# Patient Record
Sex: Female | Born: 1950 | Race: White | Hispanic: No | Smoking: Never smoker
Health system: Southern US, Community
[De-identification: ages and names within clinical notes are randomized; demographics above are authoritative.]

## PROBLEM LIST (undated history)

## (undated) DIAGNOSIS — F0390 Unspecified dementia without behavioral disturbance: Secondary | ICD-10-CM

## (undated) DIAGNOSIS — E039 Hypothyroidism, unspecified: Secondary | ICD-10-CM

## (undated) DIAGNOSIS — M1990 Unspecified osteoarthritis, unspecified site: Secondary | ICD-10-CM

## (undated) DIAGNOSIS — F419 Anxiety disorder, unspecified: Secondary | ICD-10-CM

## (undated) DIAGNOSIS — N289 Disorder of kidney and ureter, unspecified: Secondary | ICD-10-CM

## (undated) HISTORY — DX: Anxiety disorder, unspecified: F41.9

## (undated) HISTORY — DX: Disorder of kidney and ureter, unspecified: N28.9

## (undated) HISTORY — PX: VAGINAL HYSTERECTOMY: SUR661

## (undated) HISTORY — DX: Unspecified dementia, unspecified severity, without behavioral disturbance, psychotic disturbance, mood disturbance, and anxiety: F03.90

## (undated) HISTORY — DX: Unspecified osteoarthritis, unspecified site: M19.90

## (undated) HISTORY — DX: Hypothyroidism, unspecified: E03.9

---

## 2016-08-01 DIAGNOSIS — F411 Generalized anxiety disorder: Secondary | ICD-10-CM | POA: Insufficient documentation

## 2016-08-01 DIAGNOSIS — F0390 Unspecified dementia without behavioral disturbance: Secondary | ICD-10-CM | POA: Insufficient documentation

## 2016-08-01 DIAGNOSIS — F03A0 Unspecified dementia, mild, without behavioral disturbance, psychotic disturbance, mood disturbance, and anxiety: Secondary | ICD-10-CM | POA: Insufficient documentation

## 2016-08-30 ENCOUNTER — Other Ambulatory Visit: Payer: Self-pay | Admitting: Neurology

## 2016-08-30 DIAGNOSIS — R4189 Other symptoms and signs involving cognitive functions and awareness: Secondary | ICD-10-CM

## 2016-09-12 ENCOUNTER — Ambulatory Visit: Payer: Medicare Other

## 2016-10-12 ENCOUNTER — Ambulatory Visit
Admission: RE | Admit: 2016-10-12 | Discharge: 2016-10-12 | Disposition: A | Discharge status: Home / Self Care | Payer: Medicare Other | Source: Ambulatory Visit | Attending: Neurology | Admitting: Neurology

## 2016-10-12 ENCOUNTER — Encounter: Payer: Self-pay | Admitting: Radiology

## 2016-10-12 DIAGNOSIS — R4189 Other symptoms and signs involving cognitive functions and awareness: Secondary | ICD-10-CM

## 2017-05-20 ENCOUNTER — Other Ambulatory Visit: Payer: Self-pay | Admitting: Family Medicine

## 2017-05-20 DIAGNOSIS — Z1231 Encounter for screening mammogram for malignant neoplasm of breast: Secondary | ICD-10-CM

## 2017-06-26 DIAGNOSIS — R4189 Other symptoms and signs involving cognitive functions and awareness: Secondary | ICD-10-CM | POA: Insufficient documentation

## 2017-10-16 DIAGNOSIS — E039 Hypothyroidism, unspecified: Secondary | ICD-10-CM | POA: Insufficient documentation

## 2018-06-30 ENCOUNTER — Encounter: Payer: Self-pay | Admitting: Podiatry

## 2018-06-30 ENCOUNTER — Ambulatory Visit (INDEPENDENT_AMBULATORY_CARE_PROVIDER_SITE_OTHER): Payer: Medicare Other | Admitting: Podiatry

## 2018-06-30 DIAGNOSIS — M779 Enthesopathy, unspecified: Secondary | ICD-10-CM

## 2018-06-30 DIAGNOSIS — D3610 Benign neoplasm of peripheral nerves and autonomic nervous system, unspecified: Secondary | ICD-10-CM | POA: Diagnosis not present

## 2018-06-30 NOTE — Progress Notes (Signed)
This patient presents the office with chief complaint of numbness and tingling in her toes on both feet.  She says she's been experiencing numbness numbness for years and believes it is due to back problems that she has been diagnosed and treated.  She says that the numbness and tingling in her feet has been increasing the last month.  He says this area becomes painful as she walks wearing her shoes. She points to the area of her forefoot as the site of most burningand tingling.   She has provided no self treatment nor sought any professional help. She presents the office today for an evaluation and treatment of her numb and tingling toes.   General Appearance  Alert, conversant and in no acute stress.  Vascular  Dorsalis pedis and posterior tibial  pulses are palpable  bilaterally.  Capillary return is within normal limits  bilaterally. Temperature is within normal limits  bilaterally.  Neurologic  Senn-Weinstein monofilament wire test within normal limits  bilaterally. Muscle power within normal limits bilaterally.  Nails normal nails noted with no evidence of bacterial or fungal infection.  Orthopedic  No limitations of motion of motion feet .  No crepitus or effusions noted.  No bony pathology or digital deformities noted. Palpable pain noted in the second and third interspace bilaterally.  No evidence of any redness, swelling noted.  DJD first MPJ bilaterally  Skin  normotropic skin with no porokeratosis noted bilaterally.  No signs of infections or ulcers noted.    Neuroma/ Capsulitis forefeet  B/L  IE  Injection therapy.  Injection therapy using 1.0 cc. Of 2% xylocaine( 20 mg.) plus 1 cc. of kenalog-la ( 10 mg) plus 1/2 cc. of dexamethazone phosphate ( 2 mg) .  Padding to be applied to her insoles.  RTC 4 weeks.   Gardiner Barefoot DPM

## 2018-07-21 ENCOUNTER — Ambulatory Visit (INDEPENDENT_AMBULATORY_CARE_PROVIDER_SITE_OTHER): Payer: Medicare Other | Admitting: Podiatry

## 2018-07-21 ENCOUNTER — Telehealth: Payer: Self-pay | Admitting: Podiatry

## 2018-07-21 ENCOUNTER — Encounter: Payer: Self-pay | Admitting: Podiatry

## 2018-07-21 DIAGNOSIS — M779 Enthesopathy, unspecified: Secondary | ICD-10-CM | POA: Diagnosis not present

## 2018-07-21 DIAGNOSIS — D3610 Benign neoplasm of peripheral nerves and autonomic nervous system, unspecified: Secondary | ICD-10-CM

## 2018-07-21 NOTE — Telephone Encounter (Signed)
Per pt nieceGildardo Ramos) Dana Ramos is coming in for a injection.  Pt. Is wanting to use a wheelchair at [facility]. Her niece is wondering if she really needs to use a wheelchair after a injection. Would like for you to explain to pt what she needs to do as far as her mobility at [facility]. Please send notes back to facility with findings and if she needs to use  A wheelchair. In Dana Ramos mind she feels like you told her to use a wheelchair, so she gets really upset.

## 2018-07-21 NOTE — Progress Notes (Signed)
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  Patient states that she feels like her feet have made her feel 20 years younger.  She says she has better motion in the toes on both feet. Following the injection.  She says there is still numbness and tingling in her foot, but she is much better.  She returns to the office for continued evaluation and treatment of her feet.  She was told to continue to use the padding that was applied last visit.  General Appearance  Alert, conversant and in no acute stress.  Vascular  Dorsalis pedis and posterior tibial  pulses are palpable  bilaterally.  Capillary return is within normal limits  bilaterally. Temperature is within normal limits  bilaterally.  Neurologic  Senn-Weinstein monofilament wire test within normal limits  bilaterally. Muscle power within normal limits bilaterally.  Nails normal nails noted with no evidence of bacterial or fungal infection.  Orthopedic  No limitations of motion of motion feet .  No crepitus or effusions noted. 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  Skin  normotropic skin with no porokeratosis noted bilaterally.  No signs of infections or ulcers noted.    S/P capsulitis /neuroma forefoot  B/l.    ROV.  Patient is very pleased with her progress and states she is much better.  She was told to return to the clinic if the problem reoccurs.     Gardiner Barefoot DPM

## 2018-07-22 ENCOUNTER — Telehealth: Payer: Self-pay | Admitting: Podiatry

## 2018-07-22 NOTE — Telephone Encounter (Signed)
Pt's niece, Gildardo Griffes states she thinks they're okay now, but this morning states Nanine Means stated pt had walked down to the lunchroom and then later stated she could not walk. Lattie Haw asked if pt received any treatment or injection yesterday. I told her pt did not receive an injection or treatment/procedure.

## 2018-07-22 NOTE — Telephone Encounter (Signed)
Gildardo Griffes is the pts niece. Pt is saying that she is unable to walk today. Wants to speak with you.

## 2018-09-25 DIAGNOSIS — M545 Low back pain: Secondary | ICD-10-CM

## 2018-09-25 DIAGNOSIS — R200 Anesthesia of skin: Secondary | ICD-10-CM | POA: Insufficient documentation

## 2018-09-25 DIAGNOSIS — G8929 Other chronic pain: Secondary | ICD-10-CM | POA: Insufficient documentation

## 2018-09-29 ENCOUNTER — Other Ambulatory Visit: Payer: Self-pay | Admitting: Acute Care

## 2018-09-29 DIAGNOSIS — M545 Low back pain: Secondary | ICD-10-CM

## 2018-10-11 ENCOUNTER — Ambulatory Visit: Payer: Medicare Other

## 2018-10-22 ENCOUNTER — Ambulatory Visit
Admission: RE | Admit: 2018-10-22 | Discharge: 2018-10-22 | Disposition: A | Discharge status: Home / Self Care | Payer: Medicare Other | Source: Ambulatory Visit | Attending: Acute Care | Admitting: Acute Care

## 2018-10-22 DIAGNOSIS — M5136 Other intervertebral disc degeneration, lumbar region: Secondary | ICD-10-CM | POA: Diagnosis not present

## 2018-10-22 DIAGNOSIS — G8929 Other chronic pain: Secondary | ICD-10-CM | POA: Diagnosis not present

## 2018-10-22 DIAGNOSIS — M545 Low back pain, unspecified: Secondary | ICD-10-CM

## 2018-10-22 DIAGNOSIS — M4316 Spondylolisthesis, lumbar region: Secondary | ICD-10-CM | POA: Diagnosis not present

## 2018-10-22 DIAGNOSIS — M5137 Other intervertebral disc degeneration, lumbosacral region: Secondary | ICD-10-CM | POA: Insufficient documentation

## 2018-10-22 DIAGNOSIS — R200 Anesthesia of skin: Secondary | ICD-10-CM | POA: Diagnosis not present

## 2018-10-22 DIAGNOSIS — M48061 Spinal stenosis, lumbar region without neurogenic claudication: Secondary | ICD-10-CM | POA: Insufficient documentation

## 2019-01-26 ENCOUNTER — Encounter: Payer: Self-pay | Admitting: Psychiatry

## 2019-01-26 ENCOUNTER — Ambulatory Visit (INDEPENDENT_AMBULATORY_CARE_PROVIDER_SITE_OTHER): Payer: Medicare Other | Admitting: Psychiatry

## 2019-01-26 ENCOUNTER — Other Ambulatory Visit: Payer: Self-pay

## 2019-01-26 VITALS — BP 144/73 | HR 73 | Temp 98.7°F | Ht 61.81 in | Wt 139.2 lb

## 2019-01-26 DIAGNOSIS — F0281 Dementia in other diseases classified elsewhere with behavioral disturbance: Secondary | ICD-10-CM | POA: Diagnosis not present

## 2019-01-26 DIAGNOSIS — F02818 Dementia in other diseases classified elsewhere, unspecified severity, with other behavioral disturbance: Secondary | ICD-10-CM

## 2019-01-26 DIAGNOSIS — F411 Generalized anxiety disorder: Secondary | ICD-10-CM | POA: Diagnosis not present

## 2019-01-26 MED ORDER — SERTRALINE HCL 100 MG PO TABS: 100.0000 mg | ORAL_TABLET | Freq: Every day | ORAL | 2 refills | Status: AC

## 2019-01-26 MED ORDER — ARIPIPRAZOLE 2 MG PO TABS: 2.0000 mg | ORAL_TABLET | Freq: Every day | ORAL | 1 refills | Status: AC

## 2019-01-26 NOTE — Patient Instructions (Signed)
Aripiprazole tablets What is this medicine? ARIPIPRAZOLE (ay ri PIP ray zole) is an atypical antipsychotic. It is used to treat schizophrenia and bipolar disorder, also known as manic-depression. It is also used to treat Tourette's disorder and some symptoms of autism. This medicine may also be used in combination with antidepressants to treat major depressive disorder. This medicine may be used for other purposes; ask your health care provider or pharmacist if you have questions. COMMON BRAND NAME(S): Abilify What should I tell my health care provider before I take this medicine? They need to know if you have any of these conditions: -dehydration -dementia -diabetes -heart disease -history of stroke -low blood counts, like low white cell, platelet, or red cell counts -Parkinson's disease -seizures -suicidal thoughts, plans, or attempt; a previous suicide attempt by you or a family member -an unusual or allergic reaction to aripiprazole, other medicines, foods, dyes, or preservatives -pregnant or trying to get pregnant -breast-feeding How should I use this medicine? Take this medicine by mouth with a glass of water. Follow the directions on the prescription label. You can take this medicine with or without food. Take your doses at regular intervals. Do not take your medicine more often than directed. Do not stop taking except on the advice of your doctor or health care professional. A special MedGuide will be given to you by the pharmacist with each prescription and refill. Be sure to read this information carefully each time. Talk to your pediatrician regarding the use of this medicine in children. While this drug may be prescribed for children as young as 6 years of age for selected conditions, precautions do apply. Overdosage: If you think you have taken too much of this medicine contact a poison control center or emergency room at once. NOTE: This medicine is only for you. Do not share  this medicine with others. What if I miss a dose? If you miss a dose, take it as soon as you can. If it is almost time for your next dose, take only that dose. Do not take double or extra doses. What may interact with this medicine? Do not take this medicine with any of the following medications: -brexpiprazole -cisapride -dofetilide -dronedarone -metoclopramide -pimozide -thioridazine This medicine may also interact with the following medications: -alcohol -carbamazepine -certain medicines for anxiety or sleep -certain medicines for blood pressure -certain medicines for fungal infections like ketoconazole, fluconazole, posaconazole, and itraconazole -clarithromycin -fluoxetine -other medicines that prolong the QT interval (cause an abnormal heart rhythm) -paroxetine -quinidine -rifampin This list may not describe all possible interactions. Give your health care provider a list of all the medicines, herbs, non-prescription drugs, or dietary supplements you use. Also tell them if you smoke, drink alcohol, or use illegal drugs. Some items may interact with your medicine. What should I watch for while using this medicine? Visit your doctor or health care professional for regular checks on your progress. It may be several weeks before you see the full effects of this medicine. Do not suddenly stop taking this medicine. You may need to gradually reduce the dose. Patients and their families should watch out for worsening depression or thoughts of suicide. Also watch out for sudden changes in feelings such as feeling anxious, agitated, panicky, irritable, hostile, aggressive, impulsive, severely restless, overly excited and hyperactive, or not being able to sleep. If this happens, especially at the beginning of antidepressant treatment or after a change in dose, call your health care professional. You may get dizzy or drowsy. Do   not drive, use machinery, or do anything that needs mental  alertness until you know how this medicine affects you. Do not stand or sit up quickly, especially if you are an older patient. This reduces the risk of dizzy or fainting spells. Alcohol can increase dizziness and drowsiness. Avoid alcoholic drinks. This medicine can reduce the response of your body to heat or cold. Dress warmly in cold weather and stay hydrated in hot weather. If possible, avoid extreme temperatures like saunas, hot tubs, very hot or cold showers, or activities that can cause dehydration such as vigorous exercise. This medicine may cause dry eyes and blurred vision. If you wear contact lenses, you may feel some discomfort. Lubricating drops may help. See your eye doctor if the problem does not go away or is severe. If you notice an increased hunger or thirst, different from your normal hunger or thirst, or if you find that you have to urinate more frequently, you should contact your health care provider as soon as possible. You may need to have your blood sugar monitored. This medicine may cause changes in your blood sugar levels. You should monitor your blood sugar frequently if you have diabetes. There have been reports of uncontrollable and strong urges to gamble, binge eat, shop, and have sex while taking this medicine. If you experience any of these or other uncontrollable and strong urges while taking this medicine, you should report it to your health care provider as soon as possible. What side effects may I notice from receiving this medicine? Side effects that you should report to your doctor or health care professional as soon as possible: -allergic reactions like skin rash, itching or hives, swelling of the face, lips, or tongue -breathing problems -confusion -feeling faint or lightheaded, falls -fever or chills, sore throat -increased hunger or thirst -increased urination -joint pain -muscles pain, spasms -problems with balance, talking, walking -restlessness or need to  keep moving -seizures -suicidal thoughts or other mood changes -trouble swallowing -uncontrollable and excessive urges (examples: gambling, binge eating, shopping, having sex) -uncontrollable head, mouth, neck, arm, or leg movements -unusually weak or tired Side effects that usually do not require medical attention (report to your doctor or health care professional if they continue or are bothersome): -blurred vision -constipation -headache -nausea, vomiting -trouble sleeping -weight gain This list may not describe all possible side effects. Call your doctor for medical advice about side effects. You may report side effects to FDA at 1-800-FDA-1088. Where should I keep my medicine? Keep out of the reach of children. Store at room temperature between 15 and 30 degrees C (59 and 86 degrees F). Throw away any unused medicine after the expiration date. NOTE: This sheet is a summary. It may not cover all possible information. If you have questions about this medicine, talk to your doctor, pharmacist, or health care provider.  2019 Elsevier/Gold Standard (2018-06-12 09:13:39)  
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
## 2019-02-02 ENCOUNTER — Telehealth: Payer: Self-pay

## 2019-02-02 ENCOUNTER — Telehealth: Payer: Self-pay | Admitting: Psychiatry

## 2019-02-02 NOTE — Telephone Encounter (Signed)
pt niece called states that dr. Shea Evans wanted to speak with her. she has an appt today at 46 but you can after that.

## 2019-02-02 NOTE — Telephone Encounter (Signed)
Returned call to Ms.Makemie Park - [phone]. Niece.  She and her husband lived a reclusive life style. Kept close contact with her mother through phone. The reason she was asked to see a psychiatrist was because of hoarding. Moved in with mother in 2015 . Some one reported to social services in 2017 about both patient and mother . She ended up at the ALF after that. Lattie Haw believes she is doing better on the new medications. Will be OK to share information in future.

## 2019-04-16 NOTE — Progress Notes (Signed)
 [facility] - Documentation Encounter  Care Coordinator conducted a chart review for the following:  Preventive Care-  Gap Closure 04/16/2019  Breast Cancer Screening PCP/Specialist Appointment already scheduled - appointment note edited  Comments: specialist appointment on 04/29/2019  Colorectal Cancer Screening PCP/Specialist Appointment already scheduled - appointment note edited  Comments: specialist appointment on 04/29/2019    Payer Activity- No flowsheet data found.  Dana Ramos  For more information on [facility] services, click here.

## 2020-03-05 IMAGING — MR MR LUMBAR SPINE W/O CM
5 of 7 series · 30 of 48 positions shown · non-contrast
Comparison: None.

CLINICAL DATA: Lifting injury with low back pain since 1780. Pain
radiates to both legs.

EXAM:
MRI LUMBAR SPINE WITHOUT CONTRAST
TECHNIQUE: Multiplanar, multisequence MR imaging of the lumbar spine was
performed. No intravenous contrast was administered.

[Series 5: T2 · sagittal · 4.0mm · 1.02mm/px · 7 of 17 slices shown (1 of 3)]
[im 1/17]
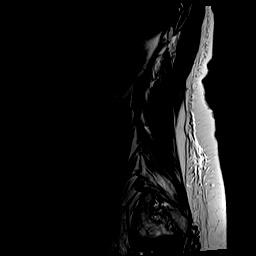
[im 3/17]
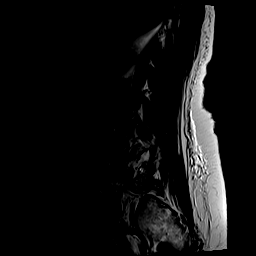
[im 6/17]
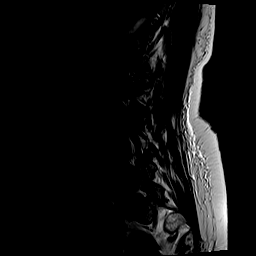
[im 9/17]
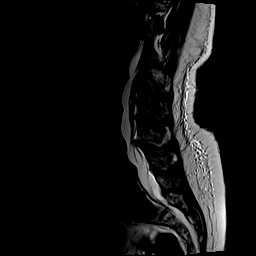
[im 11/17]
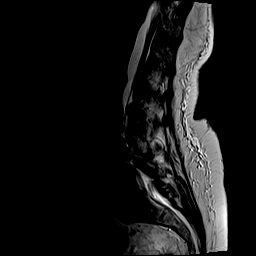
[im 14/17]
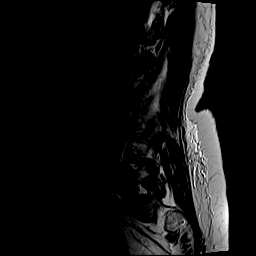
[im 17/17]
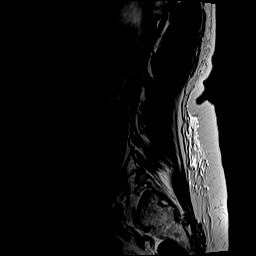

[Series 6: T1 · sagittal · 4.0mm · 1.02mm/px · 7 of 17 slices shown (1 of 2)]
[im 1/17]
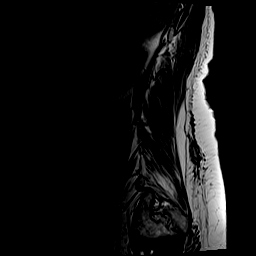
[im 3/17]
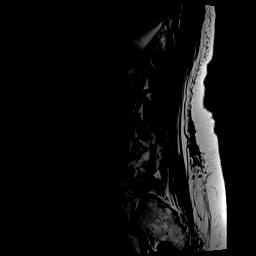
[im 6/17]
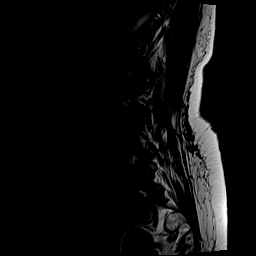
[im 9/17]
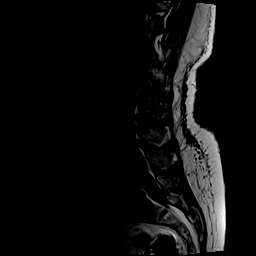
[im 11/17]
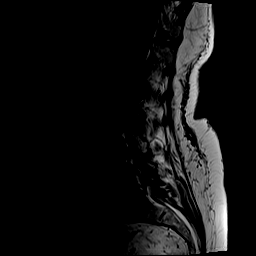
[im 14/17]
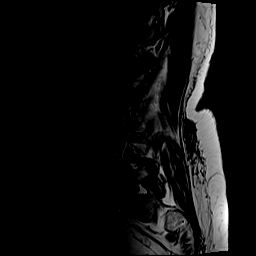
[im 17/17]
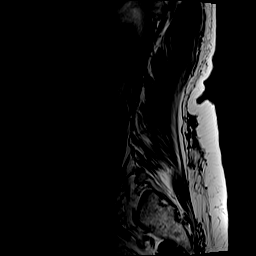

[Series 8: T2 · axial · 4.0mm · 0.57mm/px · z∈[+9,+126]mm · 9 of 25 slices shown (2 of 3)]
[im 1/25]
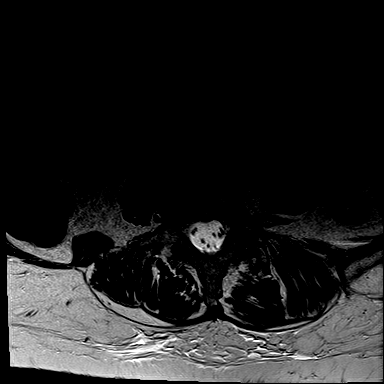
[im 4/25]
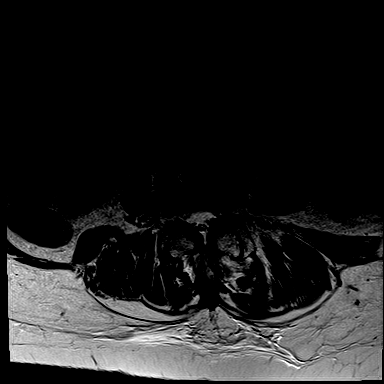
[im 7/25]
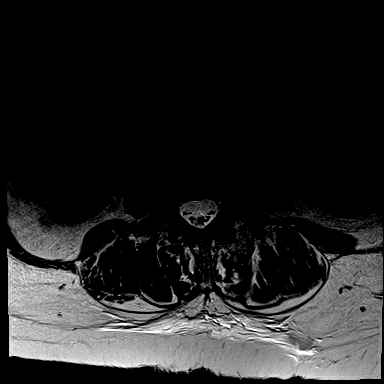
[im 10/25]
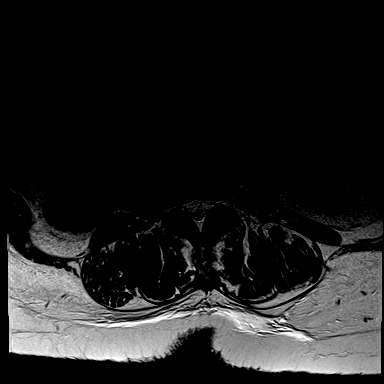
[im 13/25]
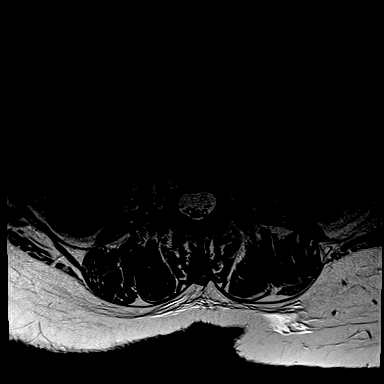
[im 16/25]
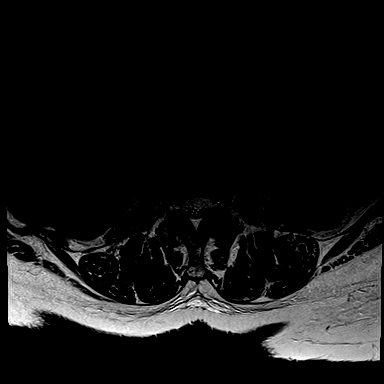
[im 19/25]
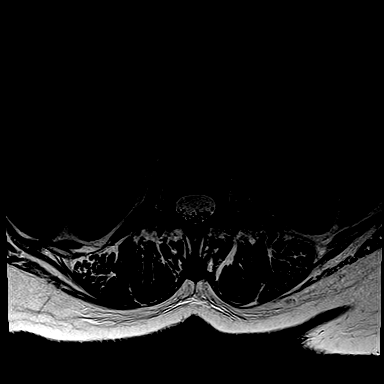
[im 22/25]
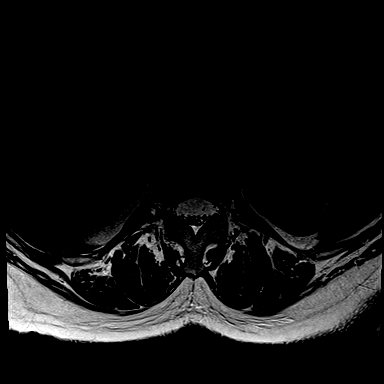
[im 25/25]
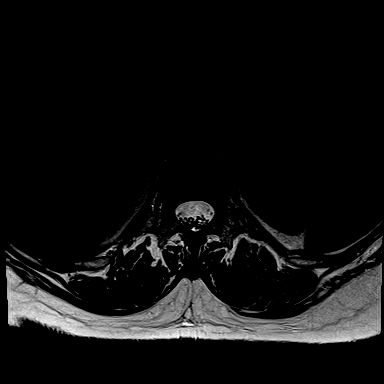

[Series 9: T2 · axial · 4.0mm · 0.57mm/px · z∈[-130,-64]mm · 5 of 15 slices shown (3 of 3)]
[im 1/15]
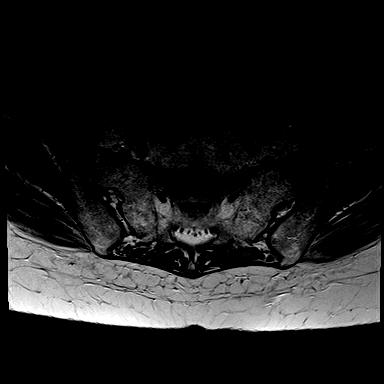
[im 4/15]
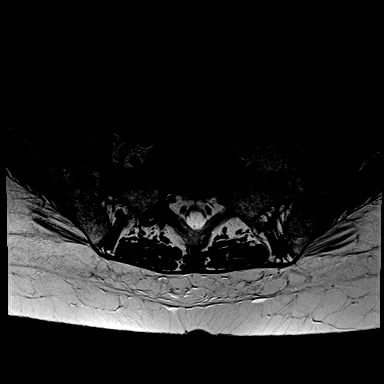
[im 8/15]
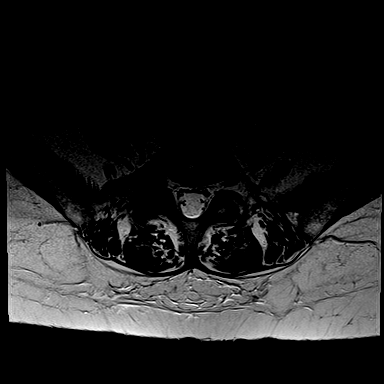
[im 11/15]
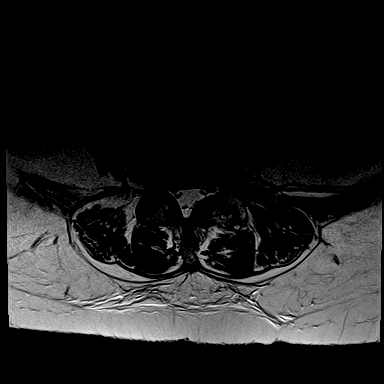
[im 15/15]
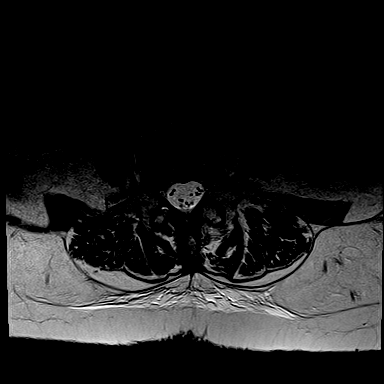

[Series 10: T1 · axial · 4.0mm · 0.29mm/px · z∈[+9,+23]mm · 2 of 25 slices shown (2 of 2)]
[im 1/25]
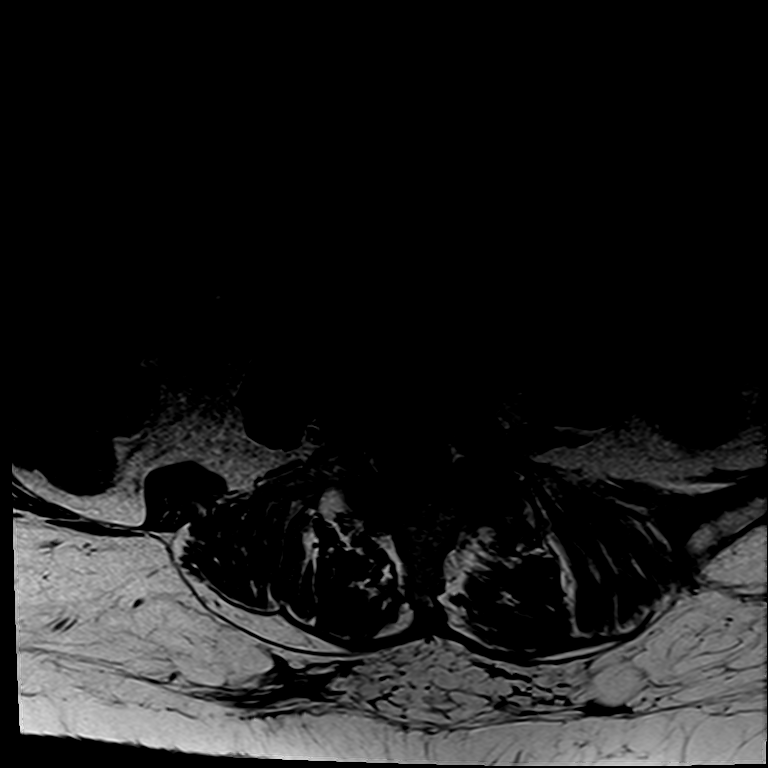
[im 4/25]
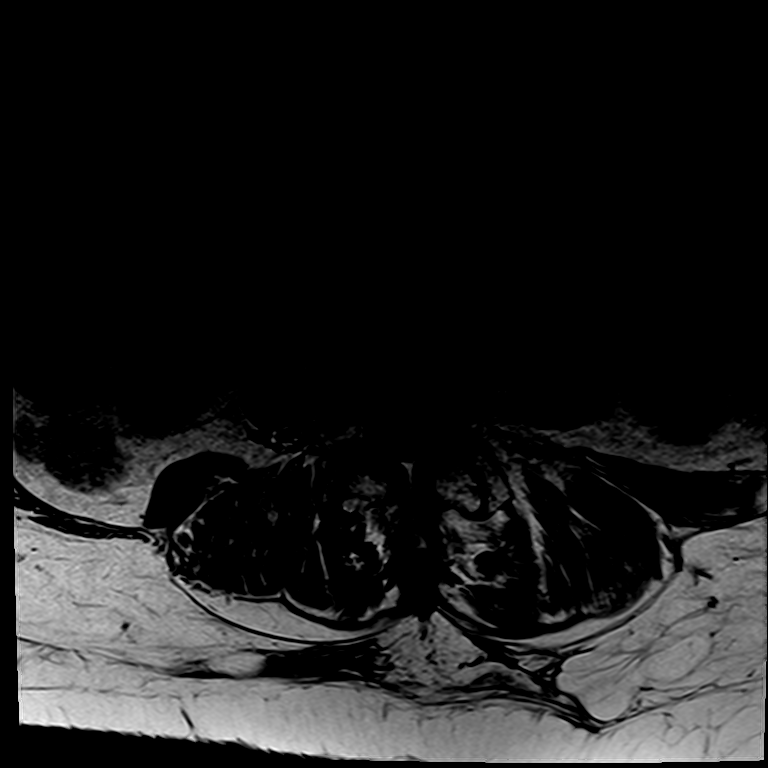

[30 of 48 positions shown; findings below may reference images not displayed]

FINDINGS: Segmentation:  5 lumbar type vertebral bodies assumed.

Alignment: Curvature convex to the right with the apex at L2-3. 2 mm
anterolisthesis L2-3. 3 mm anterolisthesis L3-4. 2 mm
anterolisthesis L4-5.

Vertebrae: No fracture or primary bone lesion. Discogenic endplate
changes at L4-5 with some edema, which may be associated with back
pain.

Conus medullaris and cauda equina: Conus extends to the T12 level.
Conus and cauda equina appear normal.

Paraspinal and other soft tissues: Negative

Disc levels:

No abnormality at T11-12 or T12-L1.

L1-2: Minimal disc bulge.  No stenosis.

L2-3: Mild bulging of the disc. Facet degeneration. One or 2 mm of
anterolisthesis. Mild narrowing of the lateral recesses without
likely neural compression.

L3-4: 3 mm of anterolisthesis due to facet osteoarthritis. Mild
bulging of the disc. Mild stenosis of the lateral recesses and
foramina, left more than right. No definite neural compression.

L4-5: Disc degeneration with loss of disc height, endplate
osteophytes and bulging of the disc. Discogenic endplate marrow
changes which could be associated with back pain. Facet and
ligamentous hypertrophy. Narrowing of the lateral recesses right
more than left. Some potential for neural compression in the lateral
recesses, particularly on the right. There is foraminal narrowing
more pronounced on the left.

L5-S1: Mild bulging of the disc. Mild facet osteoarthritis. No canal
or foraminal stenosis. Some edema associated with the facet joint on
the right could be painful.
IMPRESSION: L2-3: Mild non-compressive degenerative changes.

L3-4: Facet degeneration with 3 mm of anterolisthesis. Bulging of
the disc. Mild narrowing of the lateral recesses and foramina left
more than right. No definite neural compression however.

L4-5: More advanced chronic disc degeneration with endplate
osteophytes and bulging of the disc. Discogenic endplate changes
with edema, which could be associated with back pain. Lateral recess
and foraminal narrowing with some potential for neural compression.
Lateral recess narrowing is slightly more pronounced on the right.
Foraminal narrowing is more pronounced on the left.

L5-S1 facet degeneration worse on the right with mild facet joint
edema which could be associated with pain.

## 2022-09-29 ENCOUNTER — Other Ambulatory Visit: Payer: Self-pay

## 2022-09-29 ENCOUNTER — Emergency Department: Payer: Medicare Other

## 2022-09-29 ENCOUNTER — Emergency Department
Admission: EM | Admit: 2022-09-29 | Discharge: 2022-09-29 | Disposition: A | Discharge status: Skilled Nursing Facility | Payer: Medicare Other | Attending: Emergency Medicine | Admitting: Emergency Medicine

## 2022-09-29 ENCOUNTER — Encounter: Payer: Self-pay | Admitting: Intensive Care

## 2022-09-29 DIAGNOSIS — M25551 Pain in right hip: Secondary | ICD-10-CM | POA: Diagnosis not present

## 2022-09-29 DIAGNOSIS — M25561 Pain in right knee: Secondary | ICD-10-CM | POA: Diagnosis present

## 2022-09-29 DIAGNOSIS — F0390 Unspecified dementia without behavioral disturbance: Secondary | ICD-10-CM | POA: Insufficient documentation

## 2022-09-29 DIAGNOSIS — Y92009 Unspecified place in unspecified non-institutional (private) residence as the place of occurrence of the external cause: Secondary | ICD-10-CM | POA: Insufficient documentation

## 2022-09-29 DIAGNOSIS — W010XXA Fall on same level from slipping, tripping and stumbling without subsequent striking against object, initial encounter: Secondary | ICD-10-CM | POA: Insufficient documentation

## 2022-09-29 HISTORY — DX: Unspecified osteoarthritis, unspecified site: M19.90

## 2022-09-29 HISTORY — DX: Hypothyroidism, unspecified: E03.9

## 2022-09-29 HISTORY — DX: Disorder of kidney and ureter, unspecified: N28.9

## 2022-09-29 NOTE — ED Provider Notes (Signed)
[facility] Provider Note    Event Date/Time   First MD Initiated Contact with Patient 09/29/22 1938     (approximate)   History   Chief Complaint: Fall   HPI  Dana Ramos is a 72 y.o. female with a history of anxiety, dementia who is brought to the ED by EMS from [facility] assisted living due to a fall.  Patient reports that she had a fall at home due to tripping and losing balance.  She reports this happened 2 weeks ago.  Denies falling today.  Denies head injury or neck pain.  Only pain is in the right hip and the right knee.  She has been able to stand and bear weight since the fall.  Denies any other complaints, no chest pain or palpitations or shortness of breath.     Physical Exam   Triage Vital Signs: ED Triage Vitals  Enc Vitals Group     BP 09/29/22 1858 136/85     Pulse Rate 09/29/22 1858 88     Resp 09/29/22 1858 16     Temp 09/29/22 1858 98.7 F (37.1 C)     Temp Source 09/29/22 1858 Oral     SpO2 09/29/22 1858 93 %     Weight 09/29/22 1852 180 lb (81.6 kg)     Height 09/29/22 1852 '5\' 4"'$  (1.626 m)     Head Circumference --      Peak Flow --      Pain Score 09/29/22 1852 8     Pain Loc --      Pain Edu? --      Excl. in [place]? --     Most recent vital signs: Vitals:   09/29/22 1858  BP: 136/85  Pulse: 88  Resp: 16  Temp: 98.7 F (37.1 C)  SpO2: 93%    General: Awake, no distress.  CV:  Good peripheral perfusion.  Regular rate, heart rate 80 Resp:  Normal effort.  Clear to auscultation bilaterally Abd:  No distention.  Soft nontender Other:  Pelvis stable and nontender.  Full range of motion in all extremities.  No focal bony tenderness.  No abrasions or bruising on the extremities or scalp.  No spinal tenderness.  Full range of motion in the C-spine.   ED Results / Procedures / Treatments   Labs (all labs ordered are listed, but only abnormal results are displayed) Labs Reviewed - No data to  display   EKG    RADIOLOGY X-ray right hip and pelvis interpreted by me, negative for fracture.  Radiology report reviewed.  X-ray right knee interpreted by me, negative for fracture.  Radiology report reviewed   PROCEDURES:  Procedures   MEDICATIONS ORDERED IN ED: Medications - No data to display   IMPRESSION / MDM / [place] / ED COURSE  I reviewed the triage vital signs and the nursing notes.                              Differential diagnosis includes, but is not limited to, hip fracture, knee fracture, pelvis fracture, contusion, chronic musculoskeletal pain  Patient's presentation is most consistent with acute complicated illness / injury requiring diagnostic workup.  Patient with dementia sent to the ED due to possible fall and musculoskeletal complaints.  Per the patient's report the fall happened 2 weeks ago.  Unclear if this is just being reported today or if she had  a new fall today the patient is not able to describe precisely.  Regardless, I do not see any evidence of acute trauma.  X-rays of her hip and knee are unremarkable.  I think she is stable for discharge and primary care follow-up.       FINAL CLINICAL IMPRESSION(S) / ED DIAGNOSES   Final diagnoses:  Fall in home, initial encounter  Right knee pain, unspecified chronicity     Rx / DC Orders   ED Discharge Orders     None        Note:  This document was prepared using Dragon voice recognition software and may include unintentional dictation errors.   Carrie Mew, MD 09/29/22 2030

## 2022-09-29 NOTE — ED Notes (Signed)
Pt currently at xray.

## 2022-09-29 NOTE — ED Notes (Signed)
[facility] to transport pt to [facility]

## 2022-09-29 NOTE — ED Triage Notes (Signed)
Arrived by EMS from [facility] for fall. C/o right hip and right knee pain.  EMS vitals: 96% RA 90HR 178/94 b/p

## 2022-10-07 ENCOUNTER — Emergency Department
Admission: EM | Admit: 2022-10-07 | Discharge: 2022-10-07 | Disposition: A | Discharge status: Skilled Nursing Facility | Payer: Medicare Other | Attending: Emergency Medicine | Admitting: Emergency Medicine

## 2022-10-07 ENCOUNTER — Emergency Department: Payer: Medicare Other

## 2022-10-07 ENCOUNTER — Encounter: Payer: Self-pay | Admitting: Intensive Care

## 2022-10-07 ENCOUNTER — Other Ambulatory Visit: Payer: Self-pay

## 2022-10-07 DIAGNOSIS — W19XXXA Unspecified fall, initial encounter: Secondary | ICD-10-CM | POA: Insufficient documentation

## 2022-10-07 DIAGNOSIS — M25551 Pain in right hip: Secondary | ICD-10-CM | POA: Insufficient documentation

## 2022-10-07 DIAGNOSIS — M25552 Pain in left hip: Secondary | ICD-10-CM | POA: Insufficient documentation

## 2022-10-07 DIAGNOSIS — F0390 Unspecified dementia without behavioral disturbance: Secondary | ICD-10-CM | POA: Insufficient documentation

## 2022-10-07 LAB — CBC WITH DIFFERENTIAL/PLATELET
Abs Immature Granulocytes: 0.07 10*3/uL (ref 0.00–0.07)
Basophils Absolute: 0 10*3/uL (ref 0.0–0.1)
Basophils Relative: 0 %
Eosinophils Absolute: 0 10*3/uL (ref 0.0–0.5)
Eosinophils Relative: 0 %
HCT: 37.9 % (ref 36.0–46.0)
Hemoglobin: 12.6 g/dL (ref 12.0–15.0)
Immature Granulocytes: 1 %
Lymphocytes Relative: 19 %
Lymphs Abs: 1.8 10*3/uL (ref 0.7–4.0)
MCH: 31.8 pg (ref 26.0–34.0)
MCHC: 33.2 g/dL (ref 30.0–36.0)
MCV: 95.7 fL (ref 80.0–100.0)
Monocytes Absolute: 0.7 10*3/uL (ref 0.1–1.0)
Monocytes Relative: 7 %
Neutro Abs: 7.1 10*3/uL (ref 1.7–7.7)
Neutrophils Relative %: 73 %
Platelets: 276 10*3/uL (ref 150–400)
RBC: 3.96 MIL/uL (ref 3.87–5.11)
RDW: 12.3 % (ref 11.5–15.5)
WBC: 9.7 10*3/uL (ref 4.0–10.5)
nRBC: 0 % (ref 0.0–0.2)

## 2022-10-07 LAB — COMPREHENSIVE METABOLIC PANEL
ALT: 19 U/L (ref 0–44)
AST: 24 U/L (ref 15–41)
Albumin: 4.4 g/dL (ref 3.5–5.0)
Alkaline Phosphatase: 92 U/L (ref 38–126)
Anion gap: 13 (ref 5–15)
BUN: 10 mg/dL (ref 8–23)
CO2: 24 mmol/L (ref 22–32)
Calcium: 10.4 mg/dL — ABNORMAL HIGH (ref 8.9–10.3)
Chloride: 100 mmol/L (ref 98–111)
Creatinine, Ser: 0.87 mg/dL (ref 0.44–1.00)
GFR, Estimated: >60 mL/min (ref >60)
Glucose, Bld: 109 mg/dL — ABNORMAL HIGH (ref 70–99)
Potassium: 4.5 mmol/L (ref 3.5–5.1)
Sodium: 137 mmol/L (ref 135–145)
Total Bilirubin: 0.8 mg/dL (ref 0.3–1.2)
Total Protein: 7.4 g/dL (ref 6.5–8.1)

## 2022-10-07 LAB — URINALYSIS, ROUTINE W REFLEX MICROSCOPIC
Bilirubin Urine: NEGATIVE
Glucose, UA: NEGATIVE mg/dL
Hgb urine dipstick: NEGATIVE
Ketones, ur: 5 mg/dL — AB
Leukocytes,Ua: NEGATIVE
Nitrite: NEGATIVE
Protein, ur: NEGATIVE mg/dL
Specific Gravity, Urine: 1.021 (ref 1.005–1.030)
pH: 6 (ref 5.0–8.0)

## 2022-10-07 LAB — TROPONIN I (HIGH SENSITIVITY)
Troponin I (High Sensitivity): 6 ng/L (ref <18)
Troponin I (High Sensitivity): 5 ng/L (ref <18)

## 2022-10-07 LAB — LIPASE, BLOOD: Lipase: 32 U/L (ref 11–51)

## 2022-10-07 NOTE — ED Notes (Addendum)
Update given to caregiver Dahlia Client [phone] would like a call with update once she was seen.

## 2022-10-07 NOTE — ED Provider Triage Note (Signed)
Emergency Medicine Provider Triage Evaluation Note  Dana Ramos , a 72 y.o. female  was evaluated in triage.  History of dementia. Pt complains of AMS since last night according to staff at living facility. Two falls in 24 hours without injury according to EMS.  Patient reports that she slipped and fell. Reports she has pain in her abdomen. No vomiting or diarrhea. Patient has not attempted to walk today.   Patient Active Problem List   Diagnosis Date Noted   Chronic bilateral low back pain 09/25/2018   Numbness in feet 09/25/2018   Acquired hypothyroidism 10/16/2017   Cognitive changes 06/26/2017   Anxiety, generalized 08/01/2016   Mild dementia ([place]) 08/01/2016   .  Review of Systems  Positive: Abd pain, falls Negative: Fever, dizzy, weak  Physical Exam  There were no vitals taken for this visit. Gen:   Awake, no distress   Resp:  Normal effort  MSK:   Moves extremities without difficulty  Other:    Medical Decision Making  Medically screening exam initiated at 11:37 AM.  Appropriate orders placed.  Dana Ramos was informed that the remainder of the evaluation will be completed by another provider, this initial triage assessment does not replace that evaluation, and the importance of remaining in the ED until their evaluation is complete.     Marquette Old, PA-C 10/07/22 1143

## 2022-10-07 NOTE — ED Triage Notes (Signed)
Pt in via EMS from nursing facility. Staff reports that pt with AMS since last pm. Pt with hx of dementia and has also had 2 falls in 24 hours without injury. 137/66, HR 84, 16 RR, 96% RA, CBG 112, Temp 98.8

## 2022-10-07 NOTE — ED Notes (Signed)
Patient is in room, in bed. Patient states she has pain in her knees that she has had for years. Patient does not remember falling. Patient is oriented to name and DOB. She is unaware of time and situation.

## 2022-10-07 NOTE — ED Provider Notes (Signed)
[facility] Provider Note  Patient Contact: 8:03 PM (approximate)   History   Fall   HPI  Dana Ramos is a 72 y.o. female who presents the emergency department for evaluation after a fall.  Reportedly patient has had 2 falls in the last 24 hours.  Patient does have a history of dementia, is unable to provide much information other than confirming she did fall.  Reportedly in triage patient was complaining of pain in her hips, however currently she is denying any pain.  Patient denies headache, chest pain, shortness of breath, abdominal pain, lower extremity pain, hip pain, back pain.  Patient is oriented to herself, and the fact that she is in the hospital.  Is disoriented to time.  Patient was seen in triage by provider, had labs, imaging ordered prior to my assessment.     Physical Exam   Triage Vital Signs: ED Triage Vitals  Enc Vitals Group     BP 10/07/22 1157 (!) 142/82     Pulse Rate 10/07/22 1157 88     Resp 10/07/22 1157 16     Temp 10/07/22 1157 99 F (37.2 C)     Temp Source 10/07/22 1432 Oral     SpO2 10/07/22 1157 96 %     Weight 10/07/22 1140 180 lb (81.6 kg)     Height 10/07/22 1140 '5\' 4"'$  (1.626 m)     Head Circumference --      Peak Flow --      Pain Score --      Pain Loc --      Pain Edu? --      Excl. in [place]? --     Most recent vital signs: Vitals:   10/07/22 1743 10/07/22 1949  BP: (!) 164/85 (!) 168/80  Pulse: 92 82  Resp: 18 18  Temp: 98.3 F (36.8 C) 98.7 F (37.1 C)  SpO2: 97% 97%     General: Alert and in no acute distress. Eyes:  PERRL. EOMI. Head: No acute traumatic findings  Neck: No stridor. No cervical spine tenderness to palpation.  Cardiovascular:  Good peripheral perfusion Respiratory: Normal respiratory effort without tachypnea or retractions. Lungs CTAB. Good air entry to the bases with no decreased or absent breath sounds. Gastrointestinal: Bowel sounds 4 quadrants. Soft and nontender to  palpation. No guarding or rigidity. No palpable masses. No distention. No CVA tenderness. Musculoskeletal: Full range of motion to all extremities.  Neurologic:  No gross focal neurologic deficits are appreciated.  Skin:   No rash noted Other:   ED Results / Procedures / Treatments   Labs (all labs ordered are listed, but only abnormal results are displayed) Labs Reviewed  COMPREHENSIVE METABOLIC PANEL - Abnormal; Notable for the following components:      Result Value   Glucose, Bld 109 (*)    Calcium 10.4 (*)    All other components within normal limits  URINALYSIS, ROUTINE W REFLEX MICROSCOPIC - Abnormal; Notable for the following components:   Color, Urine YELLOW (*)    APPearance HAZY (*)    Ketones, ur 5 (*)    All other components within normal limits  CBC WITH DIFFERENTIAL/PLATELET  LIPASE, BLOOD  TROPONIN I (HIGH SENSITIVITY)  TROPONIN I (HIGH SENSITIVITY)     EKG     RADIOLOGY  I personally viewed, evaluated, and interpreted these images as part of my medical decision making, as well as reviewing the written report by the radiologist.  ED Provider  Interpretation: Imaging of the head, cervical spine without traumatic signs such as skull fracture, intracranial hemorrhage or cervical spine fracture.  Pelvis with no evidence of hip fracture.  DG Pelvis 1-2 Views  Result Date: 10/07/2022 CLINICAL DATA:  Altered mental status. Two falls in the last 24 hours. EXAM: PELVIS - 1-2 VIEW COMPARISON:  None Available. FINDINGS: No fracture or bone lesion. Hip joints, SI joints and symphysis pubis are normally spaced and aligned. Soft tissues are unremarkable. IMPRESSION: Negative. Electronically Signed   By: Lajean Manes M.D.   On: 10/07/2022 12:42   CT Head Wo Contrast  Result Date: 10/07/2022 CLINICAL DATA:  Fall.  History of dementia EXAM: CT HEAD WITHOUT CONTRAST CT CERVICAL SPINE WITHOUT CONTRAST TECHNIQUE: Multidetector CT imaging of the head and cervical spine was  performed following the standard protocol without intravenous contrast. Multiplanar CT image reconstructions of the cervical spine were also generated. RADIATION DOSE REDUCTION: This exam was performed according to the departmental dose-optimization program which includes automated exposure control, adjustment of the mA and/or kV according to patient size and/or use of iterative reconstruction technique. COMPARISON:  None Available. FINDINGS: CT HEAD FINDINGS Brain: No evidence of acute infarction, hemorrhage, hydrocephalus, extra-axial collection or mass lesion/mass effect. Generalized atrophy. Chronic small vessel ischemia in the deep cerebral white matter. Vascular: No hyperdense vessel or unexpected calcification. Skull: Normal. Negative for fracture or focal lesion. Sinuses/Orbits: No evidence of injury CT CERVICAL SPINE FINDINGS Alignment: Straightening of cervical lordosis Skull base and vertebrae: No acute finding Soft tissues and spinal canal: No prevertebral fluid or swelling. No visible canal hematoma. Disc levels:  Ordinary for age cervical spine degeneration. Upper chest: No evidence of injury IMPRESSION: No evidence of acute intracranial or cervical spine injury. Electronically Signed   By: Jorje Guild M.D.   On: 10/07/2022 12:19   CT Cervical Spine Wo Contrast  Result Date: 10/07/2022 CLINICAL DATA:  Fall.  History of dementia EXAM: CT HEAD WITHOUT CONTRAST CT CERVICAL SPINE WITHOUT CONTRAST TECHNIQUE: Multidetector CT imaging of the head and cervical spine was performed following the standard protocol without intravenous contrast. Multiplanar CT image reconstructions of the cervical spine were also generated. RADIATION DOSE REDUCTION: This exam was performed according to the departmental dose-optimization program which includes automated exposure control, adjustment of the mA and/or kV according to patient size and/or use of iterative reconstruction technique. COMPARISON:  None Available.  FINDINGS: CT HEAD FINDINGS Brain: No evidence of acute infarction, hemorrhage, hydrocephalus, extra-axial collection or mass lesion/mass effect. Generalized atrophy. Chronic small vessel ischemia in the deep cerebral white matter. Vascular: No hyperdense vessel or unexpected calcification. Skull: Normal. Negative for fracture or focal lesion. Sinuses/Orbits: No evidence of injury CT CERVICAL SPINE FINDINGS Alignment: Straightening of cervical lordosis Skull base and vertebrae: No acute finding Soft tissues and spinal canal: No prevertebral fluid or swelling. No visible canal hematoma. Disc levels:  Ordinary for age cervical spine degeneration. Upper chest: No evidence of injury IMPRESSION: No evidence of acute intracranial or cervical spine injury. Electronically Signed   By: Jorje Guild M.D.   On: 10/07/2022 12:19    PROCEDURES:  Critical Care performed: No  Procedures   MEDICATIONS ORDERED IN ED: Medications - No data to display   IMPRESSION / MDM / [place] / ED COURSE  I reviewed the triage vital signs and the nursing notes.  Differential diagnosis includes, but is not limited to, falls, altered mental status, UTI, CVA, skull fracture, intracranial hemorrhage, hip fracture  Patient's presentation is most consistent with acute presentation with potential threat to life or bodily function.   Patient's diagnosis is consistent with fall.  Patient presented to the ED via EMS from long-term care facility for complaint of 2 times fall times last 24 hours.  Patient has dementia but does endorse being here for a fall.  However she is currently complaining of no complaints.  Physical exam did not reveal any concerning trauma findings.  Patient was nontender to palpation of the skull, neck, back, hips.  Patient had labs, imaging ordered from triage.  These were reviewed.  Given patient has no complaints, no concerning findings on physical exam, labs or  imaging.  At this time patient appears stable to return to long-term care facility.  Follow-up primary care as needed.  Patient is given ED precautions to return to the ED for any worsening or new symptoms.        FINAL CLINICAL IMPRESSION(S) / ED DIAGNOSES   Final diagnoses:  None     Rx / DC Orders   ED Discharge Orders     None        Note:  This document was prepared using Dragon voice recognition software and may include unintentional dictation errors.   Brynda Peon 10/07/22 2019    Nance Pear, MD 10/07/22 2032

## 2022-10-07 NOTE — ED Notes (Signed)
Pt care handoff given to Marya Landry, Med Tech. [facility]  sen[facility] living facility. The pt has been seen and cleared for discharge back to the facility at this time. Transport for the patient has been arranged. The pt is resting comfortably in no acute visible distress at this time, she denies any pain or discomfort. Close monitoring continued.

## 2022-10-07 NOTE — ED Triage Notes (Signed)
Patient arrived by EMS from [facility] for 2 falls X24 hours without injury per staff. Staff also reports AMS. History of dementia. Patient c/o pain in lower abdomen.   Patient alert to place and self. Disoriented to situation and time.

## 2022-11-13 ENCOUNTER — Other Ambulatory Visit: Payer: Self-pay | Admitting: Physician Assistant

## 2022-11-13 DIAGNOSIS — R251 Tremor, unspecified: Secondary | ICD-10-CM

## 2022-11-13 DIAGNOSIS — R296 Repeated falls: Secondary | ICD-10-CM

## 2022-11-13 DIAGNOSIS — R4182 Altered mental status, unspecified: Secondary | ICD-10-CM

## 2022-11-13 DIAGNOSIS — R2689 Other abnormalities of gait and mobility: Secondary | ICD-10-CM

## 2022-12-06 ENCOUNTER — Ambulatory Visit: Payer: Medicare Other

## 2024-12-29 ENCOUNTER — Emergency Department

## 2024-12-29 ENCOUNTER — Emergency Department
Admission: EM | Admit: 2024-12-29 | Discharge: 2024-12-30 | Disposition: A | Discharge status: Home / Self Care | Source: Skilled Nursing Facility | Attending: Emergency Medicine | Admitting: Emergency Medicine

## 2024-12-29 DIAGNOSIS — R0602 Shortness of breath: Secondary | ICD-10-CM | POA: Diagnosis present

## 2024-12-29 DIAGNOSIS — E039 Hypothyroidism, unspecified: Secondary | ICD-10-CM | POA: Insufficient documentation

## 2024-12-29 DIAGNOSIS — F0390 Unspecified dementia without behavioral disturbance: Secondary | ICD-10-CM | POA: Insufficient documentation

## 2024-12-29 LAB — CBC WITH DIFFERENTIAL/PLATELET
Abs Immature Granulocytes: 0.08 K/uL — ABNORMAL HIGH (ref 0.00–0.07)
Basophils Absolute: 0 K/uL (ref 0.0–0.1)
Basophils Relative: 0 %
Eosinophils Absolute: 0.1 K/uL (ref 0.0–0.5)
Eosinophils Relative: 1 %
HCT: 38.4 % (ref 36.0–46.0)
Hemoglobin: 12.8 g/dL (ref 12.0–15.0)
Immature Granulocytes: 1 %
Lymphocytes Relative: 26 %
Lymphs Abs: 2.2 K/uL (ref 0.7–4.0)
MCH: 31.8 pg (ref 26.0–34.0)
MCHC: 33.3 g/dL (ref 30.0–36.0)
MCV: 95.3 fL (ref 80.0–100.0)
Monocytes Absolute: 0.5 K/uL (ref 0.1–1.0)
Monocytes Relative: 6 %
Neutro Abs: 5.6 K/uL (ref 1.7–7.7)
Neutrophils Relative %: 66 %
Platelets: 219 K/uL (ref 150–400)
RBC: 4.03 MIL/uL (ref 3.87–5.11)
RDW: 13.3 % (ref 11.5–15.5)
WBC: 8.5 K/uL (ref 4.0–10.5)
nRBC: 0 % (ref 0.0–0.2)

## 2024-12-29 LAB — BASIC METABOLIC PANEL WITH GFR
Anion gap: 12 (ref 5–15)
BUN: 26 mg/dL — ABNORMAL HIGH (ref 8–23)
CO2: 25 mmol/L (ref 22–32)
Calcium: 9.5 mg/dL (ref 8.9–10.3)
Chloride: 105 mmol/L (ref 98–111)
Creatinine, Ser: 1.01 mg/dL — ABNORMAL HIGH (ref 0.44–1.00)
GFR, Estimated: 57 mL/min — ABNORMAL LOW
Glucose, Bld: 121 mg/dL — ABNORMAL HIGH (ref 70–99)
Potassium: 4.9 mmol/L (ref 3.5–5.1)
Sodium: 141 mmol/L (ref 135–145)

## 2024-12-29 LAB — TROPONIN T, HIGH SENSITIVITY: Troponin T High Sensitivity: <15 ng/L (ref 0–19)

## 2024-12-29 NOTE — ED Triage Notes (Signed)
 BIB [facility] from [facility]. Has history of anxiety attacks, called for anxiety. Alert to baseline, patient is not oriented.  Hx Dementia.  VS wnl.  CBG: 175

## 2024-12-29 NOTE — ED Notes (Signed)
 Received return call from niece and she was informed pt is being discharged.

## 2024-12-29 NOTE — ED Notes (Signed)
 2 attempts made to call patient's niece without an answer.

## 2024-12-29 NOTE — ED Provider Notes (Signed)
 "  [facility] Provider Note    Event Date/Time   First MD Initiated Contact with Patient 12/29/24 1655     (approximate)   History   Chief Complaint Shortness of Breath   HPI  Dana Ramos is a 74 y.o. female with past medical history of hypothyroidism, dementia, and anxiety who presents to the ED complaining of shortness of breath.  Per EMS, patient appeared short of breath and anxious at her nursing facility today.  When staff at her facility was contacted, they stated that patient seemed short of breath earlier but did not necessarily appear anxious.  They do state that she has had a cough recently with multiple other residents of the facility having similar symptoms.  Here in the ED, patient denies any complaints and is not sure why she is here.     Physical Exam   Triage Vital Signs: ED Triage Vitals [12/29/24 1519]  Encounter Vitals Group     BP (!) 140/76     Girls Systolic BP Percentile      Girls Diastolic BP Percentile      Boys Systolic BP Percentile      Boys Diastolic BP Percentile      Pulse Rate 72     Resp 18     Temp 97.9 F (36.6 C)     Temp Source Oral     SpO2 98 %     Weight      Height 5' 4 (1.626 m)     Head Circumference      Peak Flow      Pain Score 0     Pain Loc      Pain Education      Exclude from Growth Chart     Most recent vital signs: Vitals:   12/29/24 1519 12/29/24 1709  BP: (!) 140/76   Pulse: 72   Resp: 18   Temp: 97.9 F (36.6 C)   SpO2: 98% 98%    Constitutional: Alert and oriented. Eyes: Conjunctivae are normal. Head: Atraumatic. Nose: No congestion/rhinnorhea. Mouth/Throat: Mucous membranes are moist.  Cardiovascular: Normal rate, regular rhythm. Grossly normal heart sounds.  2+ radial pulses bilaterally. Respiratory: Normal respiratory effort.  No retractions. Lungs CTAB. Gastrointestinal: Soft and nontender. No distention. Musculoskeletal: No lower extremity tenderness nor edema.   Neurologic:  Normal speech and language. No gross focal neurologic deficits are appreciated.    ED Results / Procedures / Treatments   Labs (all labs ordered are listed, but only abnormal results are displayed) Labs Reviewed  CBC WITH DIFFERENTIAL/PLATELET - Abnormal; Notable for the following components:      Result Value   Abs Immature Granulocytes 0.08 (*)    All other components within normal limits  BASIC METABOLIC PANEL WITH GFR - Abnormal; Notable for the following components:   Glucose, Bld 121 (*)    BUN 26 (*)    Creatinine, Ser 1.01 (*)    GFR, Estimated 57 (*)    All other components within normal limits  TROPONIN T, HIGH SENSITIVITY     EKG  ED ECG REPORT I, Carlin Palin, the attending physician, personally viewed and interpreted this ECG.   Date: 12/29/2024  EKG Time: 18:06  Rate: 67  Rhythm: normal sinus rhythm  Axis: Normal  Intervals:none  ST&T Change: None  RADIOLOGY Chest x-ray reviewed and interpreted by me with no infiltrate, edema, or effusion.  PROCEDURES:  Critical Care performed: No  Procedures   MEDICATIONS ORDERED  IN ED: Medications - No data to display   IMPRESSION / MDM / ASSESSMENT AND PLAN / ED COURSE  I reviewed the triage vital signs and the nursing notes.                              74 y.o. female with past medical history of hypothyroidism, dementia, and anxiety who presents to the ED complaining of shortness of breath today per staff at her nursing facility.  Patient's presentation is most consistent with acute presentation with potential threat to life or bodily function.  Differential diagnosis includes, but is not limited to, ACS, pneumonia, CHF, COPD, viral illness, anemia, electrolyte abnormality, AKI.  Patient nontoxic-appearing and in no acute distress, vital signs are unremarkable.  She denies any complaints and is not sure why she is here, does not seem to be having any difficulty breathing at this time and  is maintaining oxygen saturations at 98% on room air.  Will consider viral illness given her recent reported cough as well as sick contacts but given limited history, will screen EKG, chest x-ray, and labs.  EKG shows no evidence of arrhythmia or ischemia and troponin within normal limits, doubt ACS.  Chest x-ray negative for acute finding and additional labs are reassuring without significant anemia, leukocytosis, electrolyte abnormality, or AKI.  Patient continues to deny any complaints on reassessment and continues to breathe comfortably.  She is appropriate for discharge back to her nursing facility.      FINAL CLINICAL IMPRESSION(S) / ED DIAGNOSES   Final diagnoses:  Shortness of breath     Rx / DC Orders   ED Discharge Orders     None        Note:  This document was prepared using Dragon voice recognition software and may include unintentional dictation errors.   Willo Dunnings, MD 12/29/24 [id number]  "

## 2024-12-29 NOTE — ED Notes (Signed)
 After calling [facility] I was able to speak with a nurse by the name of Justice. Justice and her med tech advised the pt had been SOB earlier with walking. The pt was also pulling at her chest. The med tech advised the pt's pulse ox was 93 percent on room air so the decision was made to request EMS transport to the hospital. The pt at baseline is alert to her name but overall confused. Dr. Willo advised of same. Pt's POA is Olam the niece at [phone]

## 2024-12-29 NOTE — ED Notes (Signed)
 Called to [facility] per RN Heather/Transport to [facility]/Rep Cam.

## 2024-12-29 NOTE — ED Triage Notes (Signed)
 Refer to first nurse note. Pt confused at baseline. Hx of anxiety. Pt to ED via [facility] from [facility]. Pt not reporting pain or anxiety at this time.

## 2024-12-30 NOTE — ED Notes (Signed)
 [facility] here to transport pt to [facility]
